# Patient Record
Sex: Male | Born: 1995 | Race: Black or African American | Hispanic: No | Marital: Single | Smoking: Never smoker
Health system: Southern US, Community
[De-identification: ages and names within clinical notes are randomized; demographics above are authoritative.]

## PROBLEM LIST (undated history)

## (undated) DIAGNOSIS — T7840XA Allergy, unspecified, initial encounter: Secondary | ICD-10-CM

## (undated) DIAGNOSIS — L309 Dermatitis, unspecified: Secondary | ICD-10-CM

## (undated) HISTORY — DX: Dermatitis, unspecified: L30.9

## (undated) HISTORY — DX: Allergy, unspecified, initial encounter: T78.40XA

---
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
## 2017-05-14 ENCOUNTER — Other Ambulatory Visit: Payer: Self-pay | Admitting: Internal Medicine

## 2017-05-14 NOTE — Telephone Encounter (Signed)
Spoke with pt mother stated that she will make arrangements and have pt call the office for an appointment, requested if she could get a month refill since pt is having a flare up due to pollen. Please Advise

## 2017-05-15 NOTE — Telephone Encounter (Signed)
Pt mom is aware that pt need to schedule an appointment before any further refills.

## 2017-05-15 NOTE — Telephone Encounter (Signed)
Can refill x 1  But needs OV not seen since 2017     Tell; patient and mom (   If on DPR ?)   no further refills without OV . He needs to make appt

## 2019-11-28 ENCOUNTER — Encounter: Payer: Self-pay | Admitting: Internal Medicine

## 2019-11-28 ENCOUNTER — Ambulatory Visit: Payer: Federal, State, Local not specified - PPO | Admitting: Internal Medicine

## 2019-11-28 ENCOUNTER — Other Ambulatory Visit: Payer: Self-pay

## 2019-11-28 VITALS — BP 128/82 | HR 74 | Temp 98.7°F | Ht 70.0 in | Wt 218.0 lb

## 2019-11-28 DIAGNOSIS — L309 Dermatitis, unspecified: Secondary | ICD-10-CM

## 2019-11-28 DIAGNOSIS — J4520 Mild intermittent asthma, uncomplicated: Secondary | ICD-10-CM

## 2019-11-28 DIAGNOSIS — Z79899 Other long term (current) drug therapy: Secondary | ICD-10-CM | POA: Diagnosis not present

## 2019-11-28 DIAGNOSIS — B9689 Other specified bacterial agents as the cause of diseases classified elsewhere: Secondary | ICD-10-CM

## 2019-11-28 DIAGNOSIS — Z713 Dietary counseling and surveillance: Secondary | ICD-10-CM

## 2019-11-28 DIAGNOSIS — L089 Local infection of the skin and subcutaneous tissue, unspecified: Secondary | ICD-10-CM | POA: Diagnosis not present

## 2019-11-28 MED ORDER — TRIAMCINOLONE ACETONIDE 0.1 % EX CREA: 1.0000 "application " | TOPICAL_CREAM | Freq: Two times a day (BID) | CUTANEOUS | 1 refills | Status: AC | PRN

## 2019-11-28 MED ORDER — DOXYCYCLINE HYCLATE 100 MG PO TABS: 100.0000 mg | ORAL_TABLET | Freq: Two times a day (BID) | ORAL | 0 refills | Status: DC

## 2019-11-28 MED ORDER — ALBUTEROL SULFATE HFA 108 (90 BASE) MCG/ACT IN AERS: INHALATION_SPRAY | RESPIRATORY_TRACT | 1 refills | Status: AC

## 2019-11-28 NOTE — Progress Notes (Signed)
Chief Complaint  Patient presents with  . Other    small nodule on left thigh has gotten bigger    HPI: Mathew Bond 24 y.o. 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  NO new health  Situations discussed .   ROS: See pertinent positives and negatives per HPI. No cp sob  Working on [employer] weight   Past Medical History:  Diagnosis Date  . Allergy   . Asthma    hosp PICU 2006 with asthma no assisted ventilation  . Eczema   . Vaginal delivery    7lb 7 oz    Family History  Problem Relation Age of Onset  . Asthma Father   . Bipolar disorder Father     Social History   Socioeconomic History  . Marital status: Single    Spouse name: Not on file  . Number of children: Not on file  . Years of education: Not on file  . Highest education level: Not on file  Occupational History  . Not on file  Tobacco Use  . Smoking status: Never Smoker  . Smokeless tobacco: Never Used  Substance and Sexual Activity  . Alcohol use: Not Currently  . Drug use: Not Currently  . Sexual activity: Not Currently  Other Topics Concern  . Not on file  Social History Narrative   Parents are divorced   hhof 3 no pets no ets   9th grade [place] grades [place]   football   Social Determinants of Health   Financial Resource Strain:   . Difficulty of Paying Living Expenses: Not on file  Food Insecurity:   . Worried About Programme researcher, broadcasting/film/videounning Out of Food in the Last Year: Not on file  . Ran Out of Food in the Last Year: Not on file  Transportation Needs:   . Lack of Transportation (Medical): Not on file  . Lack of Transportation  (Non-Medical): Not on file  Physical Activity:   . Days of Exercise per Week: Not on file  . Minutes of Exercise per Session: Not on file  Stress:   . Feeling of Stress : Not on file  Social Connections:   . Frequency of Communication with Friends and Family: Not on file  . Frequency of Social Gatherings with Friends and Family: Not on file  . Attends Religious Services: Not on file  . Active Member of Clubs or Organizations: Not on file  . Attends BankerClub or Organization Meetings: Not on file  . Marital Status: Not on file    Outpatient Medications Prior to Visit  Medication Sig Dispense Refill  . PROVENTIL HFA 108 (90 Base) MCG/ACT inhaler INHALE 2 PUFFS INTO THE LUNGS EVERY 6 HOURS AS NEEDED FOR WHEEZING OR SHORTNESS OF BREATH 13.4 g 1  . triamcinolone cream (KENALOG) 0.1 % Apply 1 application topically 2 (two) times daily as needed. 30 g 1  . fluticasone (FLONASE) 50 MCG/ACT nasal spray Place 2 sprays into  both nostrils daily. (Patient not taking: Reported on 11/28/2019) 16 g 11   No facility-administered medications prior to visit.     EXAM:  BP 128/82 (BP Location: Left Arm, Patient Position: Sitting, Cuff Size: Normal)   Pulse 74   Temp 98.7 F (37.1 C) (Oral)   Ht 5\' 10"  (1.778 m)   Wt 218 lb (98.9 kg)   SpO2 99%   BMI 31.28 kg/m   Body mass index is 31.28 kg/m.  GENERAL: vitals reviewed and listed above, alert, oriented, appears well hydrated and in no acute distress HEENT: atraumatic, conjunctiva  clear, no obvious abnormalities on inspection of external nose and ears OP masked e  NECK: no obvious masses on inspection palpation  LUNGS: clear to auscultation bilaterally, no wheezes, rales or rhonchi, good air movement CV: HRRR, no clubbing cyanosis or  peripheral edema nl cap refill  MS: moves all extremities without noticeable focal  abnormality PSYCH: pleasant and cooperative, no obvious depression or anxiety Left upper thigh near groin but not in groin  A small    1 cm nodule pink mild tender  Slightly mobile   No adenopathy only  No other sin rash or lesions  No results found for: WBC, HGB, HCT, PLT, GLUCOSE, CHOL, TRIG, HDL, LDLDIRECT, LDLCALC, ALT, AST, NA, K, CL, CREATININE, BUN, CO2, TSH, PSA, INR, GLUF, HGBA1C, MICROALBUR BP Readings from Last 3 Encounters:  11/28/19 128/82  05/07/15 (!) 134/90  03/06/15 108/72    ASSESSMENT AND PLAN:  Discussed the following assessment and plan:  Localized bacterial skin infection - poss infected cyst.   Eczema, unspecified type  Medication management  Mild intermittent asthma without complication - pre exercise and ocassional  Nutritional counseling Disc about covid vaccine  Superficial skin infection poss cyst vs other  .      Expectant management.   Local care Medical management for allergic disease  Skin and min asthma reviewed  HCM  Health goals  -Patient advised to return or notify health care team  if  new concerns arise.  Patient Instructions  Warm compresses and antibiotic   For infected   Hair  Or cyst.   Refilled the albuterol  and TMC   for your eczema.   Good luck  with your studies .   Consider getting flu shot .    Consider   Mediterranean eating.   Is the healthiest     Mediterranean Diet A Mediterranean diet refers to food and lifestyle choices that are based on the traditions of countries located on the 03/08/15. This way of eating has been shown to help prevent certain conditions and improve outcomes for people who have chronic diseases, like kidney disease and heart disease. What are tips for following this plan? Lifestyle  Cook and eat meals together with your family, when possible.  Drink enough fluid to keep your urine clear or pale yellow.  Be physically active every day. This includes: ? Aerobic exercise like running or swimming. ? Leisure activities like gardening, walking, or housework.  Get 7-8 hours of sleep each night.  If recommended by  your health care provider, drink red wine in moderation. This means 1 glass a day for nonpregnant women and 2 glasses a day for men. A glass of wine equals 5 oz (150 mL). Reading food labels   Check the serving size of packaged foods. For foods such as rice and pasta, the serving size refers to the amount of cooked product, not dry.  Check  the total fat in packaged foods. Avoid foods that have saturated fat or trans fats.  Check the ingredients list for added sugars, such as corn syrup. Shopping  At the grocery store, buy most of your food from the areas near the walls of the store. This includes: ? Fresh fruits and vegetables (produce). ? Grains, beans, nuts, and seeds. Some of these may be available in unpackaged forms or large amounts (in bulk). ? Fresh seafood. ? Poultry and eggs. ? Low-fat dairy products.  Buy whole ingredients instead of prepackaged foods.  Buy fresh fruits and vegetables in-season from local farmers markets.  Buy frozen fruits and vegetables in resealable bags.  If you do not have access to quality fresh seafood, buy precooked frozen shrimp or canned fish, such as tuna, salmon, or sardines.  Buy small amounts of raw or cooked vegetables, salads, or olives from the deli or salad bar at your store.  Stock your pantry so you always have certain foods on hand, such as olive oil, canned tuna, canned tomatoes, rice, pasta, and beans. Cooking  Cook foods with extra-virgin olive oil instead of using butter or other vegetable oils.  Have meat as a side dish, and have vegetables or grains as your main dish. This means having meat in small portions or adding small amounts of meat to foods like pasta or stew.  Use beans or vegetables instead of meat in common dishes like chili or lasagna.  Experiment with different cooking methods. Try roasting or broiling vegetables instead of steaming or sauteing them.  Add frozen vegetables to soups, stews, pasta, or  rice.  Add nuts or seeds for added healthy fat at each meal. You can add these to yogurt, salads, or vegetable dishes.  Marinate fish or vegetables using olive oil, lemon juice, garlic, and fresh herbs. Meal planning   Plan to eat 1 vegetarian meal one day each week. Try to work up to 2 vegetarian meals, if possible.  Eat seafood 2 or more times a week.  Have healthy snacks readily available, such as: ? Vegetable sticks with hummus. ? [place] yogurt. ? Fruit and nut trail mix.  Eat balanced meals throughout the week. This includes: ? Fruit: 2-3 servings a day ? Vegetables: 4-5 servings a day ? Low-fat dairy: 2 servings a day ? Fish, poultry, or lean meat: 1 serving a day ? Beans and legumes: 2 or more servings a week ? Nuts and seeds: 1-2 servings a day ? Whole grains: 6-8 servings a day ? Extra-virgin olive oil: 3-4 servings a day  Limit red meat and sweets to only a few servings a month What are my food choices?  Mediterranean diet ? Recommended  Grains: Whole-grain pasta. Brown rice. Bulgar wheat. Polenta. Couscous. Whole-wheat bread. Orpah Cobb.  Vegetables: Artichokes. Beets. Broccoli. Cabbage. Carrots. Eggplant. Green beans. Chard. Kale. Spinach. Onions. Leeks. Peas. Squash. Tomatoes. Peppers. Radishes.  Fruits: Apples. Apricots. Avocado. Berries. Bananas. Cherries. Dates. Figs. Grapes. Lemons. Melon. Oranges. Peaches. Plums. Pomegranate.  Meats and other protein foods: Beans. Almonds. Sunflower seeds. Pine nuts. Peanuts. Cod. Salmon. Scallops. Shrimp. Tuna. Tilapia. Clams. Oysters. Eggs.  Dairy: Low-fat milk. Cheese. Greek yogurt.  Beverages: Water. Red wine. Herbal tea.  Fats and oils: Extra virgin olive oil. Avocado oil. Grape seed oil.  Sweets and desserts: [place] yogurt with honey. Baked apples. Poached pears. Trail mix.  Seasoning and other foods: Basil. Cilantro. Coriander. Cumin. Mint. Parsley. Sage. Rosemary. Tarragon. Garlic. Oregano. Thyme. Pepper.  Balsalmic vinegar. Tahini. Hummus. Tomato  sauce. Olives. Mushrooms. ? Limit these  Grains: Prepackaged pasta or rice dishes. Prepackaged cereal with added sugar.  Vegetables: Deep fried potatoes (french fries).  Fruits: Fruit canned in syrup.  Meats and other protein foods: Beef. Pork. Lamb. Poultry with skin. Hot dogs. Tomasa Blase.  Dairy: Ice cream. Sour cream. Whole milk.  Beverages: Juice. Sugar-sweetened soft drinks. Beer. Liquor and spirits.  Fats and oils: Butter. Canola oil. Vegetable oil. Beef fat (tallow). Lard.  Sweets and desserts: Cookies. Cakes. Pies. Candy.  Seasoning and other foods: Mayonnaise. Premade sauces and marinades. The items listed may not be a complete list. Talk with your dietitian about what dietary choices are right for you. Summary  The Mediterranean diet includes both food and lifestyle choices.  Eat a variety of fresh fruits and vegetables, beans, nuts, seeds, and whole grains.  Limit the amount of red meat and sweets that you eat.  Talk with your health care provider about whether it is safe for you to drink red wine in moderation. This means 1 glass a day for nonpregnant women and 2 glasses a day for men. A glass of wine equals 5 oz (150 mL). This information is not intended to replace advice given to you by your health care provider. Make sure you discuss any questions you have with your health care provider. Document Revised: 09/06/2015 Document Reviewed: 08/30/2015 Elsevier Patient Education  2020 [employer].             Shannon K. Ceonna Frazzini M.D.

## 2019-11-28 NOTE — Patient Instructions (Addendum)
Warm compresses and antibiotic   For infected   Hair  Or cyst.   Refilled the albuterol  and TMC   for your eczema.   Good luck  with your studies .   Consider getting flu shot .    Consider   Mediterranean eating.   Is the healthiest     Mediterranean Diet A Mediterranean diet refers to food and lifestyle choices that are based on the traditions of countries located on the [employer]. This way of eating has been shown to help prevent certain conditions and improve outcomes for people who have chronic diseases, like kidney disease and heart disease. What are tips for following this plan? Lifestyle  Cook and eat meals together with your family, when possible.  Drink enough fluid to keep your urine clear or pale yellow.  Be physically active every day. This includes: ? Aerobic exercise like running or swimming. ? Leisure activities like gardening, walking, or housework.  Get 7-8 hours of sleep each night.  If recommended by your health care provider, drink red wine in moderation. This means 1 glass a day for nonpregnant women and 2 glasses a day for men. A glass of wine equals 5 oz (150 mL). Reading food labels   Check the serving size of packaged foods. For foods such as rice and pasta, the serving size refers to the amount of cooked product, not dry.  Check the total fat in packaged foods. Avoid foods that have saturated fat or trans fats.  Check the ingredients list for added sugars, such as corn syrup. Shopping  At the grocery store, buy most of your food from the areas near the walls of the store. This includes: ? Fresh fruits and vegetables (produce). ? Grains, beans, nuts, and seeds. Some of these may be available in unpackaged forms or large amounts (in bulk). ? Fresh seafood. ? Poultry and eggs. ? Low-fat dairy products.  Buy whole ingredients instead of prepackaged foods.  Buy fresh fruits and vegetables in-season from local farmers markets.  Buy  frozen fruits and vegetables in resealable bags.  If you do not have access to quality fresh seafood, buy precooked frozen shrimp or canned fish, such as tuna, salmon, or sardines.  Buy small amounts of raw or cooked vegetables, salads, or olives from the deli or salad bar at your store.  Stock your pantry so you always have certain foods on hand, such as olive oil, canned tuna, canned tomatoes, rice, pasta, and beans. Cooking  Cook foods with extra-virgin olive oil instead of using butter or other vegetable oils.  Have meat as a side dish, and have vegetables or grains as your main dish. This means having meat in small portions or adding small amounts of meat to foods like pasta or stew.  Use beans or vegetables instead of meat in common dishes like chili or lasagna.  Experiment with different cooking methods. Try roasting or broiling vegetables instead of steaming or sauteing them.  Add frozen vegetables to soups, stews, pasta, or rice.  Add nuts or seeds for added healthy fat at each meal. You can add these to yogurt, salads, or vegetable dishes.  Marinate fish or vegetables using olive oil, lemon juice, garlic, and fresh herbs. Meal planning   Plan to eat 1 vegetarian meal one day each week. Try to work up to 2 vegetarian meals, if possible.  Eat seafood 2 or more times a week.  Have healthy snacks readily available, such as: ? Vegetable  sticks with hummus. ? [place] yogurt. ? Fruit and nut trail mix.  Eat balanced meals throughout the week. This includes: ? Fruit: 2-3 servings a day ? Vegetables: 4-5 servings a day ? Low-fat dairy: 2 servings a day ? Fish, poultry, or lean meat: 1 serving a day ? Beans and legumes: 2 or more servings a week ? Nuts and seeds: 1-2 servings a day ? Whole grains: 6-8 servings a day ? Extra-virgin olive oil: 3-4 servings a day  Limit red meat and sweets to only a few servings a month What are my food choices?  Mediterranean  diet ? Recommended  Grains: Whole-grain pasta. Brown rice. Bulgar wheat. Polenta. Couscous. Whole-wheat bread. Orpah Cobb.  Vegetables: Artichokes. Beets. Broccoli. Cabbage. Carrots. Eggplant. Green beans. Chard. Kale. Spinach. Onions. Leeks. Peas. Squash. Tomatoes. Peppers. Radishes.  Fruits: Apples. Apricots. Avocado. Berries. Bananas. Cherries. Dates. Figs. Grapes. Lemons. Melon. Oranges. Peaches. Plums. Pomegranate.  Meats and other protein foods: Beans. Almonds. Sunflower seeds. Pine nuts. Peanuts. Cod. Salmon. Scallops. Shrimp. Tuna. Tilapia. Clams. Oysters. Eggs.  Dairy: Low-fat milk. Cheese. Greek yogurt.  Beverages: Water. Red wine. Herbal tea.  Fats and oils: Extra virgin olive oil. Avocado oil. Grape seed oil.  Sweets and desserts: [place] yogurt with honey. Baked apples. Poached pears. Trail mix.  Seasoning and other foods: Basil. Cilantro. Coriander. Cumin. Mint. Parsley. Sage. Rosemary. Tarragon. Garlic. Oregano. Thyme. Pepper. Balsalmic vinegar. Tahini. Hummus. Tomato sauce. Olives. Mushrooms. ? Limit these  Grains: Prepackaged pasta or rice dishes. Prepackaged cereal with added sugar.  Vegetables: Deep fried potatoes (french fries).  Fruits: Fruit canned in syrup.  Meats and other protein foods: Beef. Pork. Lamb. Poultry with skin. Hot dogs. Tomasa Blase.  Dairy: Ice cream. Sour cream. Whole milk.  Beverages: Juice. Sugar-sweetened soft drinks. Beer. Liquor and spirits.  Fats and oils: Butter. Canola oil. Vegetable oil. Beef fat (tallow). Lard.  Sweets and desserts: Cookies. Cakes. Pies. Candy.  Seasoning and other foods: Mayonnaise. Premade sauces and marinades. The items listed may not be a complete list. Talk with your dietitian about what dietary choices are right for you. Summary  The Mediterranean diet includes both food and lifestyle choices.  Eat a variety of fresh fruits and vegetables, beans, nuts, seeds, and whole grains.  Limit the amount of red  meat and sweets that you eat.  Talk with your health care provider about whether it is safe for you to drink red wine in moderation. This means 1 glass a day for nonpregnant women and 2 glasses a day for men. A glass of wine equals 5 oz (150 mL). This information is not intended to replace advice given to you by your health care provider. Make sure you discuss any questions you have with your health care provider. Document Revised: 09/06/2015 Document Reviewed: 08/30/2015 Elsevier Patient Education  2020 [employer].

## 2020-04-19 ENCOUNTER — Encounter: Payer: Self-pay | Admitting: Family Medicine

## 2020-04-19 ENCOUNTER — Other Ambulatory Visit: Payer: Self-pay

## 2020-04-19 ENCOUNTER — Telehealth (INDEPENDENT_AMBULATORY_CARE_PROVIDER_SITE_OTHER): Payer: Federal, State, Local not specified - PPO | Admitting: Family Medicine

## 2020-04-19 DIAGNOSIS — H029 Unspecified disorder of eyelid: Secondary | ICD-10-CM

## 2020-04-19 NOTE — Progress Notes (Signed)
Virtual Visit via Video Note  I connected with Mathew Bond on 04/19/20 at 11:30 AM EDT by a video enabled telemedicine application 2/2 COVID-19 pandemic and verified that I am speaking with the correct person using two identifiers.  Location patient: home Location provider:work or home office Persons participating in the virtual visit: patient, provider  I discussed the limitations of evaluation and management by telemedicine and the availability of in person appointments. The patient expressed understanding and agreed to proceed.   HPI: Pt is a 25 yo male with pmh sig for asthma, allergies, eczema followed by Dr. Fabian Sharp and seen for acute concern.  Pt with a bump on R upper eye lid x 3-4 yrs.  Area has become larger.  It is not painful, pruritic, or erythematous.  Pt would like the bump removed.  ROS: See pertinent positives and negatives per HPI.  Past Medical History:  Diagnosis Date  . Allergy   . Asthma    hosp PICU 2006 with asthma no assisted ventilation  . Eczema   . Vaginal delivery    7lb 7 oz    No past surgical history on file.  Family History  Problem Relation Age of Onset  . Asthma Father   . Bipolar disorder Father      Current Outpatient Medications:  .  albuterol (PROVENTIL HFA) 108 (90 Base) MCG/ACT inhaler, INHALE 2 PUFFS INTO THE LUNGS EVERY 6 HOURS AS NEEDED FOR WHEEZING OR SHORTNESS OF BREATH, Disp: 13.4 g, Rfl: 1 .  doxycycline (VIBRA-TABS) 100 MG tablet, Take 1 tablet (100 mg total) by mouth 2 (two) times daily., Disp: 20 tablet, Rfl: 0 .  triamcinolone cream (KENALOG) 0.1 %, Apply 1 application topically 2 (two) times daily as needed., Disp: 30 g, Rfl: 1 .  fluticasone (FLONASE) 50 MCG/ACT nasal spray, Place 2 sprays into both nostrils daily. (Patient not taking: No sig reported), Disp: 16 g, Rfl: 11  EXAM:  VITALS per patient if applicable: RR between 12-20 bpm  GENERAL: alert, oriented, appears well and in no acute distress  HEENT:  atraumatic, conjunctiva clear, no obvious abnormalities on inspection of external nose and ears.  Right upper eyelid with ~5 mm raised flesh-colored papule, no erythema noted.  NECK: normal movements of the head and neck  LUNGS: on inspection no signs of respiratory distress, breathing rate appears normal, no obvious gross SOB, gasping or wheezing  CV: no obvious cyanosis  MS: moves all visible extremities without noticeable abnormality  PSYCH/NEURO: pleasant and cooperative, no obvious depression or anxiety, speech and thought processing grossly intact  ASSESSMENT AND PLAN:  Discussed the following assessment and plan:  Lesion of right upper eyelid  -Discussed possible benign causes including epidermoid cyst versus lipoma.  Cholesterol deposit less likely. -Discussed options for removal - Plan: Ambulatory referral to Plastic Surgery  Follow-up as needed   I discussed the assessment and treatment plan with the patient. The patient was provided an opportunity to ask questions and all were answered. The patient agreed with the plan and demonstrated an understanding of the instructions.   The patient was advised to call back or seek an in-person evaluation if the symptoms worsen or if the condition fails to improve as anticipated.  Deeann Saint, MD

## 2020-06-19 ENCOUNTER — Telehealth: Payer: Self-pay | Admitting: Internal Medicine

## 2020-06-19 NOTE — Telephone Encounter (Signed)
Pts mother is calling in stating that she is calling in b/c the pts ointment for eczema is not working and would like to see if it can be changed to Rx Clobetasol Propionate. harm:  [facility]  Pts mother prefer that the pt be called instead of her [phone].

## 2020-06-19 NOTE — Telephone Encounter (Signed)
Advise visit   If wants to discuss using  Temovate for  His eczema  Could do virtual if  No in person possible

## 2020-06-20 NOTE — Telephone Encounter (Signed)
Left a message for the patient to return my call.  

## 2020-06-20 NOTE — Telephone Encounter (Signed)
I spoke with the patient and a virtual visit was scheduled for 07/02/2020.

## 2020-07-02 ENCOUNTER — Encounter: Payer: Self-pay | Admitting: Internal Medicine

## 2020-07-02 ENCOUNTER — Telehealth (INDEPENDENT_AMBULATORY_CARE_PROVIDER_SITE_OTHER): Payer: Federal, State, Local not specified - PPO | Admitting: Internal Medicine

## 2020-07-02 VITALS — Ht 70.0 in

## 2020-07-02 DIAGNOSIS — L309 Dermatitis, unspecified: Secondary | ICD-10-CM | POA: Diagnosis not present

## 2020-07-02 DIAGNOSIS — Z79899 Other long term (current) drug therapy: Secondary | ICD-10-CM | POA: Diagnosis not present

## 2020-07-02 MED ORDER — CLOBETASOL PROPIONATE 0.05 % EX OINT: 1.0000 "application " | TOPICAL_OINTMENT | Freq: Two times a day (BID) | CUTANEOUS | 0 refills | Status: DC

## 2020-07-02 NOTE — Progress Notes (Signed)
Virtual Visit via Video Note  I connected withNAME@ on 07/02/20 at  4:00 PM EDT by a video enabled telemedicine application and verified that I am speaking with the correct person using two identifiers. Location patient: home Location provider:work  office Persons participating in the virtual visit: patient, provider  [place] national recommendations  regarding COVID 19 pandemic   video visit is advised over in office visit for this patient.  Patient aware  of the limitations of evaluation and management by telemedicine and  availability of in person appointments. and agreed to proceed.   HPI: Mathew Bond presents for video visit last visit was a number of years ago.  He has a known history of allergy and Eczemol quiescent except for skin rashes. He has used [facility] but in the recent hot weather he is having rashes flaring up that easily responds to about 2 days of clobetasol 0.05 ointment.  [facility] does not really help sometimes "makes it worse".  Hot and sweaty arem and chest flare   shoulders  has been using in past last use of clobetasol was about a month ago he may have gotten that prescription from the health center there in the past but usually only needs to use for a couple days and then stop. Uses moisturizer after shower skin hydration. He recently graduated [employer] criminal justice is moving to [employer] to have a job there.  Has not received the COVID-vaccine intentionally nor had the disease although roommate had the disease. Past Medical History:  Diagnosis Date   Allergy    Asthma    hosp PICU 2006 with asthma no assisted ventilation   Eczema    Vaginal delivery    7lb 7 oz    History reviewed. No pertinent surgical history.  Family History  Problem Relation Age of Onset   Asthma Father    Bipolar disorder Father     Social History   Tobacco Use   Smoking status: Never   Smokeless tobacco: Never  Substance Use Topics   Alcohol use: Not Currently   Drug  use: Not Currently      Current Outpatient Medications:    albuterol (PROVENTIL HFA) 108 (90 Base) MCG/ACT inhaler, INHALE 2 PUFFS INTO THE LUNGS EVERY 6 HOURS AS NEEDED FOR WHEEZING OR SHORTNESS OF BREATH, Disp: 13.4 g, Rfl: 1   clobetasol ointment (TEMOVATE) 0.05 %, Apply 1 application topically 2 (two) times daily. Limit 2 weeks for eczema, Disp: 30 g, Rfl: 0   doxycycline (VIBRA-TABS) 100 MG tablet, Take 1 tablet (100 mg total) by mouth 2 (two) times daily., Disp: 20 tablet, Rfl: 0   fluticasone (FLONASE) 50 MCG/ACT nasal spray, Place 2 sprays into both nostrils daily., Disp: 16 g, Rfl: 11   triamcinolone cream (KENALOG) 0.1 %, Apply 1 application topically 2 (two) times daily as needed., Disp: 30 g, Rfl: 1  EXAM: BP Readings from Last 3 Encounters:  11/28/19 128/82  05/07/15 (!) 134/90  03/06/15 108/72    VITALS per patient if applicable:  GENERAL: alert, oriented, appears well and in no acute distress  HEENT: atraumatic, conjunttiva clear, no obvious abnormalities on inspection of external nose and ears  NECK: normal movements of the head and neck  LUNGS: on inspection no signs of respiratory distress, breathing rate appears normal, no obvious gross SOB, gasping or wheezing Skin papular eczematous type area top of shoulder and antecubital area some rash in the anterior chest and 1 area question intertriginous.  No blisters or  pustules infection looking or streaking. CV: no obvious cyanosis MS: moves all visible extremities without noticeable abnormality PSYCH/NEURO: pleasant and cooperative, no obvious depression or anxiety, speech and thought processing grossly intact No results found for: WBC, HGB, HCT, PLT, GLUCOSE, CHOL, TRIG, HDL, LDLDIRECT, LDLCALC, ALT, AST, NA, K, CL, CREATININE, BUN, CO2, TSH, PSA, INR, GLUF, HGBA1C, MICROALBUR  ASSESSMENT AND PLAN:  Discussed the following assessment and plan:    ICD-10-CM   1. Eczema, unspecified type  L30.9     2. Dermatitis   L30.9     3. Medication management  Z79.899      Suspected eczema although some of the chest areas could be other.  Cautioned clobetasol is a 2-week limit high-dose potency and risk of rebound can do down shifting from clobetasol to [facility] if needed Can call for refill if needed suggest CPX or in person visit when convenient either here or at a new medical home Glad he is doing well. Counseled.   Expectant management and discussion of plan and treatment with opportunity to ask questions and all were answered. The patient agreed with the plan and demonstrated an understanding of the instructions.   Advised to call back or seek an in-person evaluation if worsening  or having  further concerns . Return for in person when   back in the area.    Berniece Andreas, MD

## 2020-07-12 ENCOUNTER — Ambulatory Visit (INDEPENDENT_AMBULATORY_CARE_PROVIDER_SITE_OTHER): Payer: Federal, State, Local not specified - PPO | Admitting: Plastic Surgery

## 2020-07-12 ENCOUNTER — Other Ambulatory Visit: Payer: Self-pay

## 2020-07-12 ENCOUNTER — Encounter: Payer: Self-pay | Admitting: Plastic Surgery

## 2020-07-12 VITALS — BP 131/80 | HR 71 | Ht 71.0 in | Wt 212.6 lb

## 2020-07-12 DIAGNOSIS — L989 Disorder of the skin and subcutaneous tissue, unspecified: Secondary | ICD-10-CM

## 2020-07-12 NOTE — Progress Notes (Signed)
   Referring Provider Deeann Saint, MD [address] [place],  [place] [address]   CC:  Chief Complaint  Patient presents with   Advice Only      Mathew Bond is an 25 y.o. male.  HPI: Patient presents to discuss a right upper eyelid cyst.  Is been present for a few years.  It is getting larger in size.  It sometimes affects his vision.  He would like to have it removed.  No Known Allergies  Outpatient Encounter Medications as of 07/12/2020  Medication Sig   albuterol (PROVENTIL HFA) 108 (90 Base) MCG/ACT inhaler INHALE 2 PUFFS INTO THE LUNGS EVERY 6 HOURS AS NEEDED FOR WHEEZING OR SHORTNESS OF BREATH   clobetasol ointment (TEMOVATE) 0.05 % Apply 1 application topically 2 (two) times daily. Limit 2 weeks for eczema   fluticasone (FLONASE) 50 MCG/ACT nasal spray Place 2 sprays into both nostrils daily.   triamcinolone cream (KENALOG) 0.1 % Apply 1 application topically 2 (two) times daily as needed.   [DISCONTINUED] doxycycline (VIBRA-TABS) 100 MG tablet Take 1 tablet (100 mg total) by mouth 2 (two) times daily.   No facility-administered encounter medications on file as of 07/12/2020.     Past Medical History:  Diagnosis Date   Allergy    Asthma    hosp PICU 2006 with asthma no assisted ventilation   Eczema    Vaginal delivery    7lb 7 oz    No past surgical history on file.  Family History  Problem Relation Age of Onset   Asthma Father    Bipolar disorder Father     Social History   Social History Narrative   Parents are divorced   hhof 3 no pets no ets   9th grade [employer] grades ok   [employer]     Review of Systems General: Denies fevers, chills, weight loss CV: Denies chest pain, shortness of breath, palpitations  Physical Exam Vitals with BMI 07/12/2020 07/02/2020 11/28/2019  Height 5\' 11"  5\' 10"  5\' 10"   Weight 212 lbs 10 oz - 218 lbs  BMI 29.66 - 31.28  Systolic 131 - 128  Diastolic 80 - 82  Pulse 71 - 74    General:  No acute distress,   Alert and oriented, Non-Toxic, Normal speech and affect Examination on the right upper eyelid shows a 4 to 5 mm cystic lesion with what looks like a punctum in the center.  It is very superficial.  Noes other surrounding skin changes.  Normal levator function.  Looks to have symmetric eyelid margin position.  Assessment/Plan Patient presents with a cystic lesion in the right upper eyelid.  We discussed excision.  We discussed the risks include bleeding, infection, damage to surrounding structures and need for additional procedures.  All of his questions were answered and plan to move forward.  07/12/2020, 1:01 PM

## 2020-09-13 ENCOUNTER — Other Ambulatory Visit: Payer: Self-pay

## 2020-09-13 ENCOUNTER — Ambulatory Visit (INDEPENDENT_AMBULATORY_CARE_PROVIDER_SITE_OTHER): Payer: Federal, State, Local not specified - PPO | Admitting: Plastic Surgery

## 2020-09-13 ENCOUNTER — Encounter: Payer: Self-pay | Admitting: Plastic Surgery

## 2020-09-13 ENCOUNTER — Other Ambulatory Visit (HOSPITAL_COMMUNITY)
Admission: RE | Admit: 2020-09-13 | Discharge: 2020-09-13 | Disposition: A | Discharge status: Home / Self Care | Payer: Federal, State, Local not specified - PPO | Source: Ambulatory Visit | Attending: Plastic Surgery | Admitting: Plastic Surgery

## 2020-09-13 VITALS — BP 147/86 | HR 65

## 2020-09-13 DIAGNOSIS — L989 Disorder of the skin and subcutaneous tissue, unspecified: Secondary | ICD-10-CM | POA: Insufficient documentation

## 2020-09-13 NOTE — Progress Notes (Signed)
Operative Note   DATE OF OPERATION: 09/13/2020  LOCATION:    SURGICAL DEPARTMENT: Plastic Surgery  PREOPERATIVE DIAGNOSES: Right upper eyelid cyst  POSTOPERATIVE DIAGNOSES:  same  PROCEDURE:  Excision of right upper eyelid cyst measuring 1 cm Complex closure measuring 1 cm  SURGEON: Ancil Linsey, MD  ANESTHESIA:  Local  COMPLICATIONS: None.   INDICATIONS FOR PROCEDURE:  The patient, Mathew Bond is a 25 y.o. male born on [birth year], is here for treatment of right upper eyelid cyst MRN: [id number]  CONSENT:  Informed consent was obtained directly from the patient. Risks, benefits and alternatives were fully discussed. Specific risks including but not limited to bleeding, infection, hematoma, seroma, scarring, pain, infection, wound healing problems, and need for further surgery were all discussed. The patient did have an ample opportunity to have questions answered to satisfaction.   DESCRIPTION OF PROCEDURE:  Local anesthesia was administered. The patient's operative site was prepped and draped in a sterile fashion. A time out was performed and all information was confirmed to be correct.  The lesion was excised with a 15 blade.  Hemostasis was obtained.  Circumferential undermining was performed and the skin was advanced and closed in layers with interrupted buried Monocryl sutures and 5-0 fast gut for the skin.  The lesion excised measured 1 cm, and the total length of closure measured 1 cm.    The patient tolerated the procedure well.  There were no complications.

## 2020-09-17 LAB — SURGICAL PATHOLOGY

## 2020-09-27 ENCOUNTER — Ambulatory Visit: Payer: Federal, State, Local not specified - PPO | Admitting: Surgical

## 2020-10-12 ENCOUNTER — Other Ambulatory Visit: Payer: Federal, State, Local not specified - PPO

## 2020-10-12 ENCOUNTER — Telehealth: Payer: Self-pay | Admitting: Internal Medicine

## 2020-10-12 MED ORDER — CLOBETASOL PROPIONATE 0.05 % EX OINT: 1.0000 "application " | TOPICAL_OINTMENT | Freq: Two times a day (BID) | CUTANEOUS | 0 refills | Status: DC

## 2020-10-12 NOTE — Telephone Encounter (Signed)
Patient called because he was requesting refills for clobetasol ointment (TEMOVATE) 0.05 % If no refills can be sent, patient would like to speak about why     Please send to  [id number] - [place], [place] - [address] AT [facility] [address] Phone:  [phone]  Fax:  [phone]      Good callback number is [phone]   Please Advise

## 2020-10-15 NOTE — Telephone Encounter (Signed)
RX has been sent to pt's pharmacy 

## 2021-03-26 ENCOUNTER — Other Ambulatory Visit: Payer: Self-pay | Admitting: Internal Medicine

## 2021-03-27 MED ORDER — CLOBETASOL PROPIONATE 0.05 % EX OINT: 1.0000 "application " | TOPICAL_OINTMENT | Freq: Two times a day (BID) | CUTANEOUS | 0 refills | Status: AC

## 2021-08-01 ENCOUNTER — Other Ambulatory Visit: Payer: Self-pay | Admitting: Internal Medicine

## 2021-08-02 ENCOUNTER — Telehealth: Payer: Self-pay | Admitting: Internal Medicine

## 2021-08-02 NOTE — Telephone Encounter (Signed)
Pt called to request a refill of the: clobetasol ointment (TEMOVATE) 0.05 %  Please send to  [facility] #[id number] - [place], [place] - [address] AT [facility] [address] Phone:  [phone]  Fax:  [phone]      Thank you.

## 2021-08-06 NOTE — Telephone Encounter (Signed)
Called Pt to schedule appointment - no answer - left vm for Pt to call back to schedule.

## 2021-08-07 NOTE — Telephone Encounter (Signed)
Noted  

## 2022-08-08 ENCOUNTER — Telehealth: Payer: Federal, State, Local not specified - PPO | Admitting: Nurse Practitioner

## 2022-08-08 DIAGNOSIS — J4520 Mild intermittent asthma, uncomplicated: Secondary | ICD-10-CM

## 2022-08-08 MED ORDER — ALBUTEROL SULFATE HFA 108 (90 BASE) MCG/ACT IN AERS: 2.0000 | INHALATION_SPRAY | Freq: Four times a day (QID) | RESPIRATORY_TRACT | 0 refills | Status: AC | PRN

## 2022-08-08 NOTE — Progress Notes (Signed)
E-Visit for Asthma  Based on what you have shared with me, it looks like you may have a flare up of your asthma.  Asthma is a chronic (ongoing) lung disease which results in airway obstruction, inflammation and hyper-responsiveness.   Asthma symptoms vary from person to person, with common symptoms including nighttime awakening and decreased ability to participate in normal activities as a result of shortness of breath. It is often triggered by changes in weather, changes in the season, changes in air temperature, or inside (home, school, daycare or work) allergens such as animal dander, mold, mildew, woodstoves or cockroaches.   It can also be triggered by hormonal changes, extreme emotion, physical exertion or an upper respiratory tract illness.     It is important to identify the trigger, and then eliminate or avoid the trigger if possible.   If you have been prescribed medications to be taken on a regular basis, it is important to follow the asthma action plan and to follow guidelines to adjust medication in response to increasing symptoms of decreased peak expiratory flow rate  Treatment: I have prescribed: Albuterol (Proventil HFA; Ventolin HFA) 108 (90 Base) MCG/ACT Inhaler 2 puffs into the lungs every six hours as needed for wheezing or shortness of breath  HOME CARE Only take medications as instructed by your medical team. Consider wearing a mask or scarf to improve breathing air temperature have been shown to decrease irritation and decrease exacerbations Get rest. Taking a steamy shower or using a humidifier may help nasal congestion sand ease sore throat pain. You can place a towel over your head and breathe in the steam from hot water coming from a faucet. Using a saline nasal spray works much the same way.  Cough drops, hare candies and sore throat lozenges may ease your cough.  Avoid  close contacts especially the very you and the elderly Cover your mouth if you cough or sneeze Always remember to wash your hands.    GET HELP RIGHT AWAY IF: You develop worsening symptoms; breathlessness at rest, drowsy, confused or agitated, unable to speak in full sentences You have coughing fits You develop a severe headache or visual changes You develop shortness of breath, difficulty breathing or start having chest pain Your symptoms persist after you have completed your treatment plan If your symptoms do not improve within 10 days  MAKE SURE YOU Understand these instructions. Will watch your condition. Will get help right away if you are not doing well or get worse.   Your e-visit answers were reviewed by a board certified advanced clinical practitioner to complete your personal care plan, Depending upon the condition, your plan could have included both over the counter or prescription medications.   Please review your pharmacy choice. Your safety is important to us. If you have drug allergies check your prescription carefully.  You can use MyChart to ask questions about today's visit, request a non-urgent  call back, or ask for a work or school excuse for 24 hours related to this e-Visit. If it has been greater than 24 hours you will need to follow up with your provider, or enter a new e-Visit to address those concerns.   You will get an e-mail in the next two days asking about your experience. I hope that your e-visit has been valuable and will speed your recovery. Thank you for using e-visits.   Meds ordered this encounter  Medications   albuterol (VENTOLIN HFA) 108 (90 Base) MCG/ACT inhaler      Sig: Inhale 2 puffs into the lungs every 6 (six) hours as needed for wheezing or shortness of breath.    Dispense:  8 g    Refill:  0    I spent approximately 5 minutes reviewing the patient's history, current symptoms and coordinating their care today.   

## 2023-01-26 ENCOUNTER — Telehealth: Payer: Self-pay | Admitting: Internal Medicine

## 2023-01-26 NOTE — Telephone Encounter (Signed)
 Called pt to sch annual physical, left vm. Pls sch pt for physical appt.
